# Patient Record
Sex: Female | Born: 1940 | Race: White | Hispanic: No | Marital: Married | Smoking: Never smoker
Health system: Southern US, Community
[De-identification: ages and names within clinical notes are randomized; demographics above are authoritative.]

## PROBLEM LIST (undated history)

## (undated) DIAGNOSIS — I10 Essential (primary) hypertension: Secondary | ICD-10-CM

## (undated) DIAGNOSIS — N289 Disorder of kidney and ureter, unspecified: Secondary | ICD-10-CM

---

## 1969-04-24 HISTORY — PX: ABDOMINAL HYSTERECTOMY: SUR658

## 2007-04-26 LAB — HM COLONOSCOPY: HM Colonoscopy: NORMAL

## 2010-08-12 ENCOUNTER — Ambulatory Visit: Payer: Self-pay | Admitting: Internal Medicine

## 2011-01-05 ENCOUNTER — Ambulatory Visit: Payer: Self-pay | Admitting: Internal Medicine

## 2011-07-07 ENCOUNTER — Emergency Department: Payer: Self-pay | Admitting: Emergency Medicine

## 2011-07-07 LAB — COMPREHENSIVE METABOLIC PANEL
Albumin: 3.9 g/dL (ref 3.4–5.0)
Anion Gap: 11 (ref 7–16)
Calcium, Total: 9.3 mg/dL (ref 8.5–10.1)
Chloride: 110 mmol/L — ABNORMAL HIGH (ref 98–107)
Co2: 21 mmol/L (ref 21–32)
EGFR (African American): 45 — ABNORMAL LOW
EGFR (Non-African Amer.): 37 — ABNORMAL LOW
Glucose: 84 mg/dL (ref 65–99)
Osmolality: 288 (ref 275–301)
Potassium: 4.8 mmol/L (ref 3.5–5.1)
SGOT(AST): 33 U/L (ref 15–37)

## 2011-07-07 LAB — CBC
HCT: 37.1 % (ref 35.0–47.0)
HGB: 12.6 g/dL (ref 12.0–16.0)
MCH: 31.7 pg (ref 26.0–34.0)
RBC: 3.98 10*6/uL (ref 3.80–5.20)
WBC: 6.9 10*3/uL (ref 3.6–11.0)

## 2011-07-07 LAB — TROPONIN I: Troponin-I: <0.02 ng/mL

## 2012-02-22 ENCOUNTER — Ambulatory Visit: Payer: Self-pay | Admitting: Internal Medicine

## 2013-03-04 ENCOUNTER — Ambulatory Visit: Payer: Self-pay | Admitting: Internal Medicine

## 2013-03-17 ENCOUNTER — Ambulatory Visit: Payer: Self-pay | Admitting: Internal Medicine

## 2013-05-13 IMAGING — MG MM DIGITAL SCREENING BILAT W/ CAD
1 series · 4 of 4 positions shown · non-contrast
Comparison: 01/22/2011, 01/03/2010.

REASON FOR EXAM: SCR MAMMO NO ORDER
COMMENTS:

PROCEDURE:     MMM - MMM DGT SCR NO ORDER W/CAD  - February 22, 2012  [DATE]
RESULT:

[Series 9688: R CC · right · 4 of 4 slices shown]
[im 1/4]
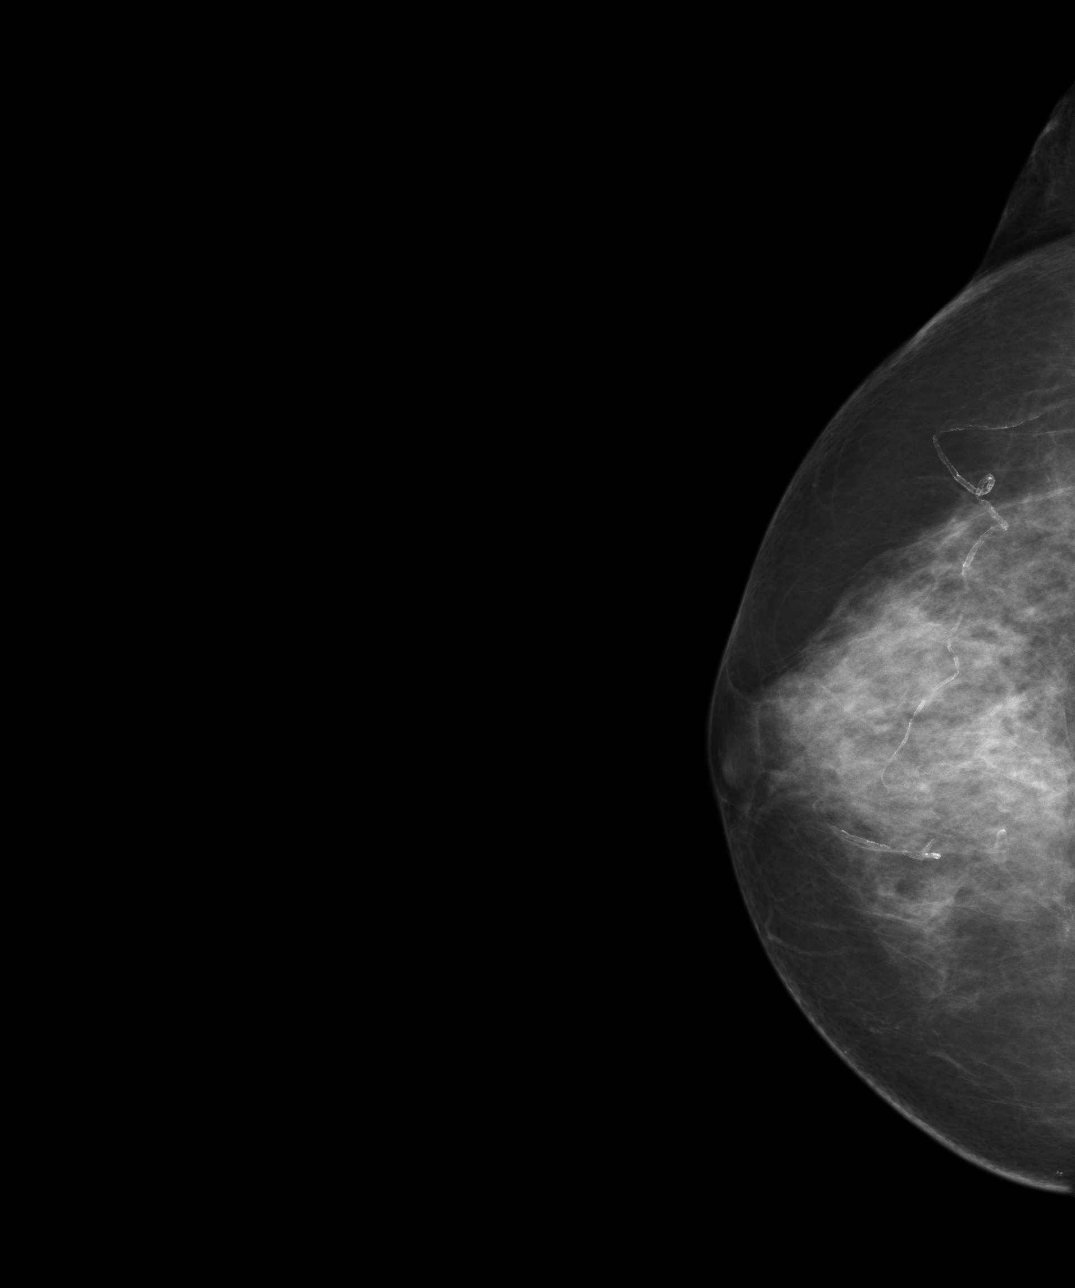
[im 2/4]
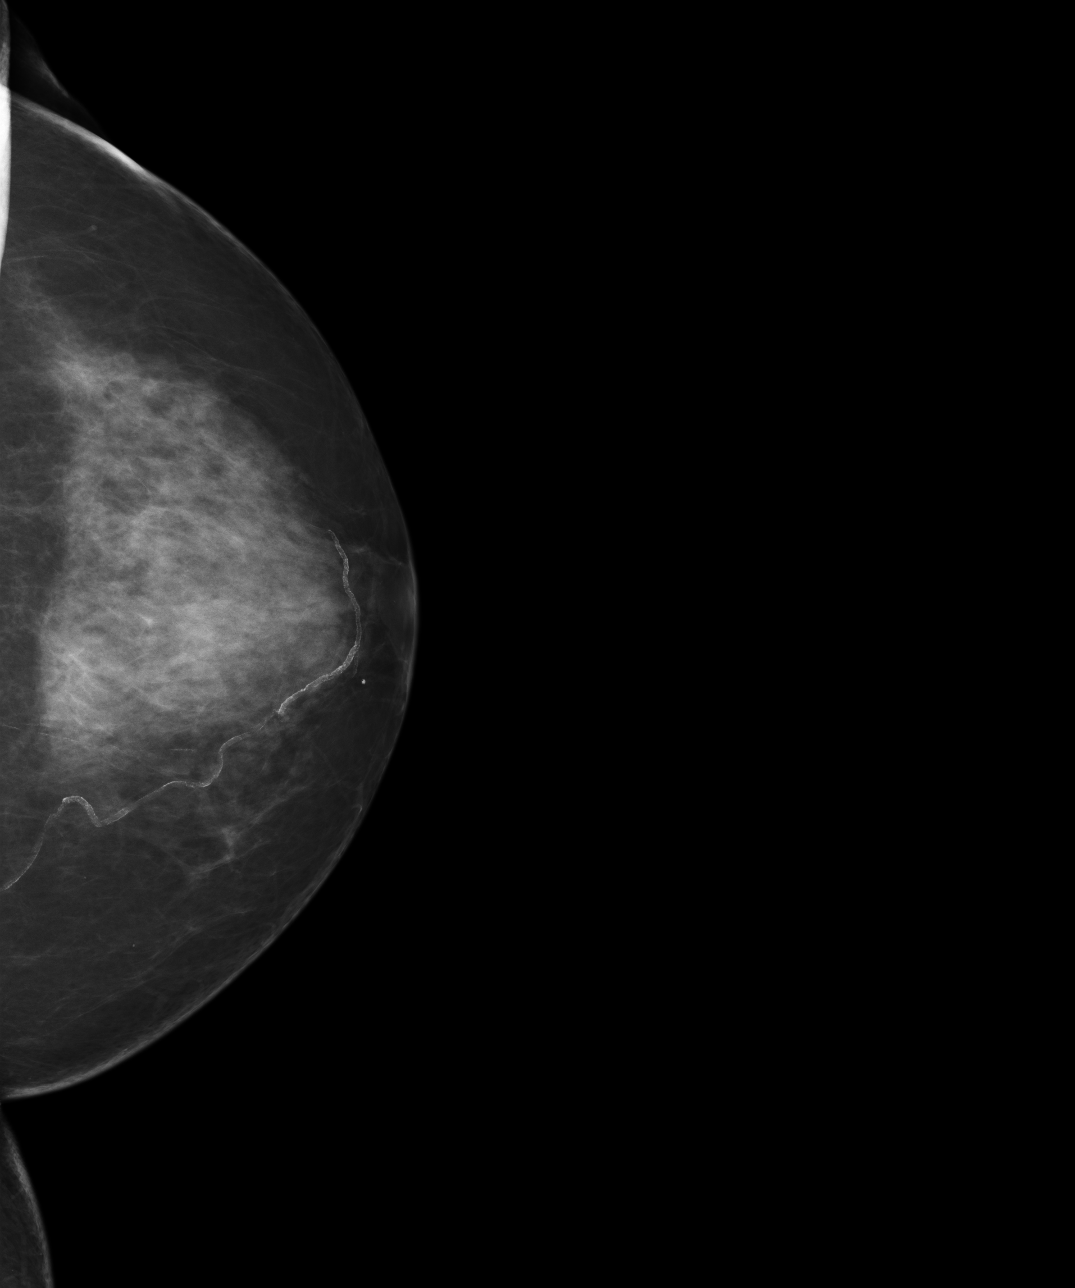
[im 3/4]
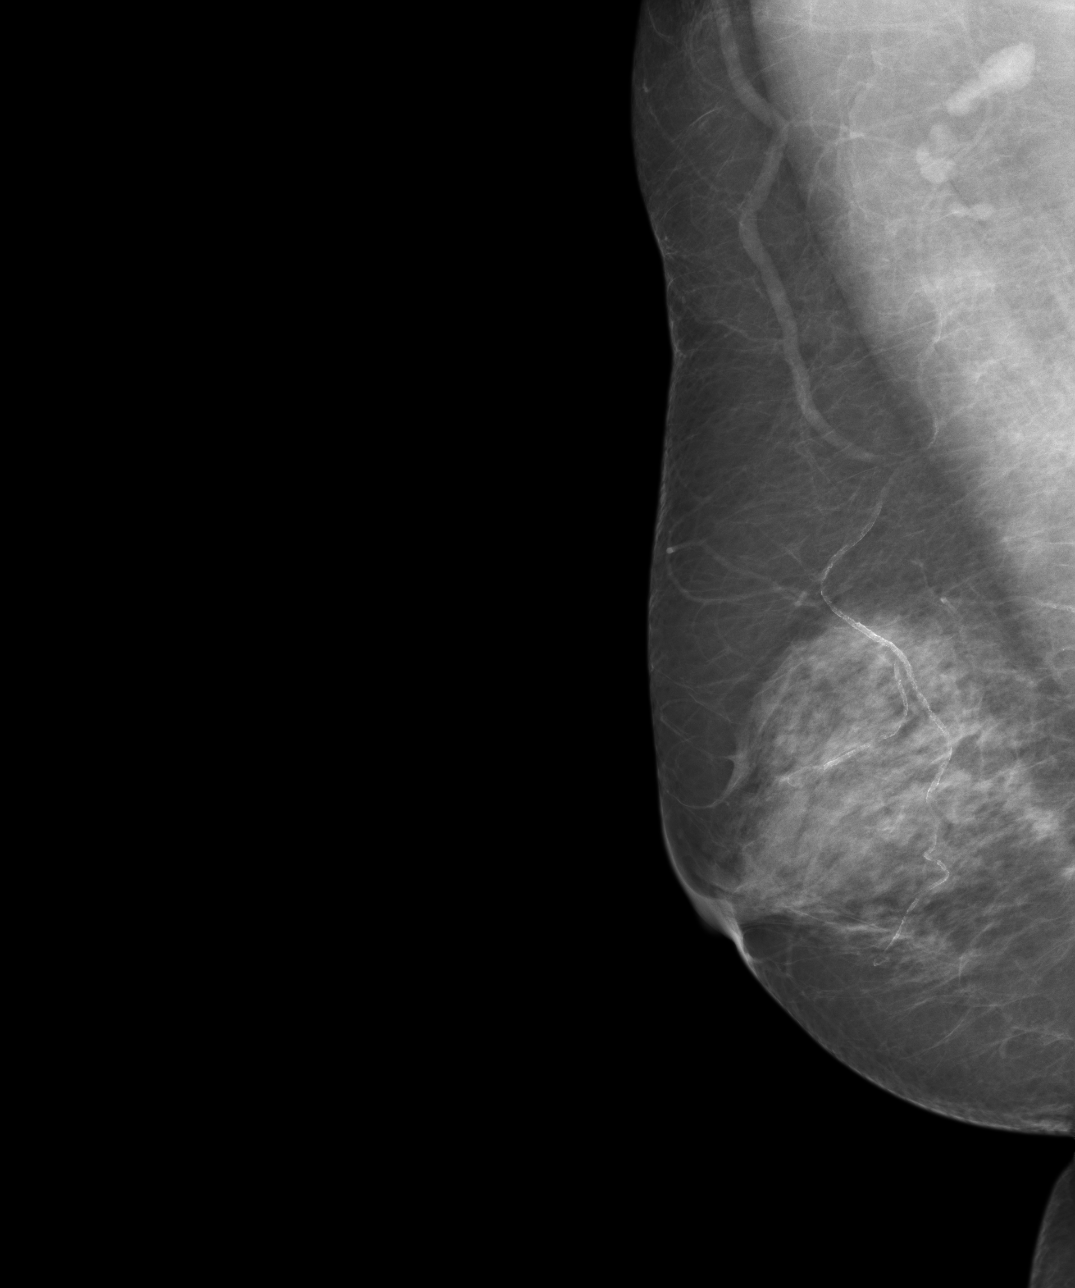
[im 4/4]
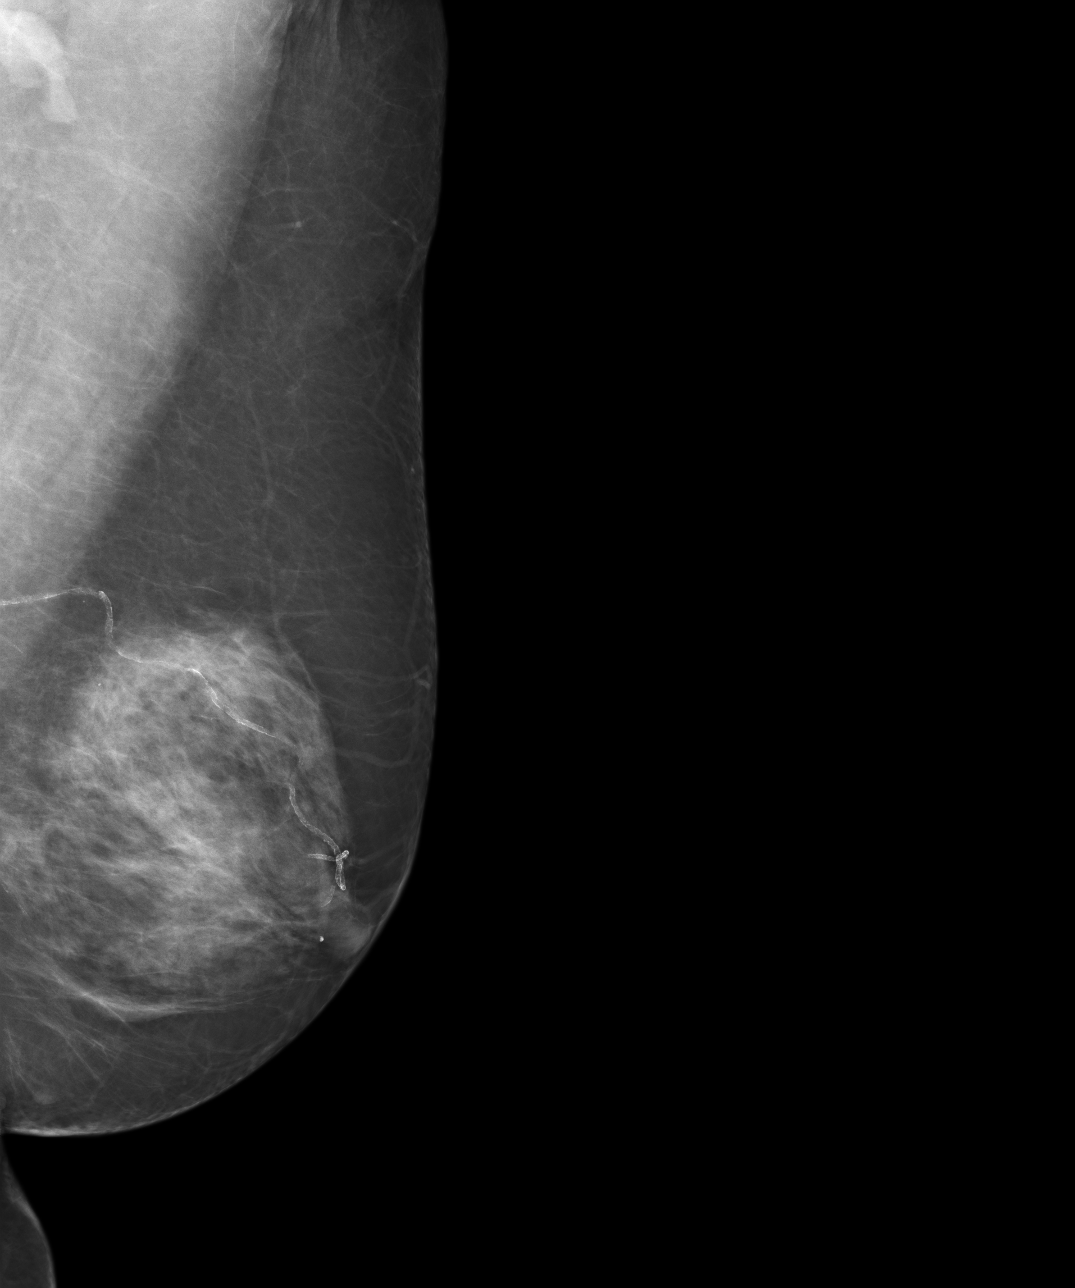

[4 of 4 positions shown; findings below may reference images not displayed]

FINDINGS: The breast tissue is heterogeneously dense, which may lower the sensitivity
of mammography. No suspicious masses or calcifications are identified. No
areas of architectural distortion.
IMPRESSION: 1.     BI-RADS: Category 1 Negative.
2.     Recommend continued annual screening mammography.

Thank you for this opportunity to contribute to the care of your patient.

A NEGATIVE MAMMOGRAM REPORT DOES NOT PRECLUDE BIOPSY OR OTHER EVALUATION OF
A CLINICALLY PALPABLE OR OTHERWISE SUSPICIOUS MASS OR LESION. BREAST CANCER
MAY NOT BE DETECTED BY MAMMOGRAPHY IN UP TO 10% OF CASES.

## 2014-03-24 LAB — LIPID PANEL
Cholesterol: 225 mg/dL — AB (ref 0–200)
HDL: 64 mg/dL (ref 35–70)
LDL CALC: 142 mg/dL
Triglycerides: 93 mg/dL (ref 40–160)

## 2014-03-24 LAB — CBC AND DIFFERENTIAL
HCT: 39 % (ref 36–46)
HEMOGLOBIN: 13 g/dL (ref 12.0–16.0)

## 2014-03-24 LAB — TSH: TSH: 1.5 u[IU]/mL (ref <=5.90)

## 2014-03-31 ENCOUNTER — Ambulatory Visit: Payer: Self-pay | Admitting: Internal Medicine

## 2014-03-31 LAB — HM MAMMOGRAPHY: HM MAMMO: NORMAL

## 2014-05-27 DIAGNOSIS — I129 Hypertensive chronic kidney disease with stage 1 through stage 4 chronic kidney disease, or unspecified chronic kidney disease: Secondary | ICD-10-CM | POA: Diagnosis not present

## 2014-05-27 DIAGNOSIS — E780 Pure hypercholesterolemia: Secondary | ICD-10-CM | POA: Diagnosis not present

## 2014-05-27 DIAGNOSIS — N183 Chronic kidney disease, stage 3 (moderate): Secondary | ICD-10-CM | POA: Diagnosis not present

## 2014-06-02 DIAGNOSIS — N1330 Unspecified hydronephrosis: Secondary | ICD-10-CM | POA: Diagnosis not present

## 2014-06-02 DIAGNOSIS — N271 Small kidney, bilateral: Secondary | ICD-10-CM | POA: Diagnosis not present

## 2014-06-12 DIAGNOSIS — N1330 Unspecified hydronephrosis: Secondary | ICD-10-CM | POA: Diagnosis not present

## 2014-06-12 DIAGNOSIS — R911 Solitary pulmonary nodule: Secondary | ICD-10-CM | POA: Diagnosis not present

## 2014-09-07 DIAGNOSIS — N184 Chronic kidney disease, stage 4 (severe): Secondary | ICD-10-CM | POA: Diagnosis not present

## 2014-09-07 LAB — BASIC METABOLIC PANEL
BUN: 25 mg/dL — AB (ref 4–21)
Creatinine: 1.4 mg/dL — AB (ref <=1.1)

## 2014-09-10 DIAGNOSIS — N183 Chronic kidney disease, stage 3 (moderate): Secondary | ICD-10-CM | POA: Diagnosis not present

## 2014-09-12 ENCOUNTER — Encounter: Payer: Self-pay | Admitting: Internal Medicine

## 2014-09-12 DIAGNOSIS — N6019 Diffuse cystic mastopathy of unspecified breast: Secondary | ICD-10-CM | POA: Insufficient documentation

## 2014-09-12 DIAGNOSIS — I10 Essential (primary) hypertension: Secondary | ICD-10-CM | POA: Insufficient documentation

## 2014-09-12 DIAGNOSIS — N184 Chronic kidney disease, stage 4 (severe): Secondary | ICD-10-CM | POA: Insufficient documentation

## 2014-09-12 DIAGNOSIS — E785 Hyperlipidemia, unspecified: Secondary | ICD-10-CM | POA: Insufficient documentation

## 2014-09-25 ENCOUNTER — Ambulatory Visit
Admission: EM | Admit: 2014-09-25 | Discharge: 2014-09-25 | Disposition: A | Discharge status: Home / Self Care | Payer: Medicare Other | Attending: Family Medicine | Admitting: Family Medicine

## 2014-09-25 ENCOUNTER — Encounter: Payer: Self-pay | Admitting: Registered Nurse

## 2014-09-25 DIAGNOSIS — L03116 Cellulitis of left lower limb: Secondary | ICD-10-CM | POA: Diagnosis not present

## 2014-09-25 DIAGNOSIS — N184 Chronic kidney disease, stage 4 (severe): Secondary | ICD-10-CM | POA: Diagnosis not present

## 2014-09-25 DIAGNOSIS — S80811A Abrasion, right lower leg, initial encounter: Secondary | ICD-10-CM

## 2014-09-25 DIAGNOSIS — L089 Local infection of the skin and subcutaneous tissue, unspecified: Secondary | ICD-10-CM

## 2014-09-25 MED ORDER — CEPHALEXIN 500 MG PO CAPS: 500.0000 mg | ORAL_CAPSULE | Freq: Two times a day (BID) | ORAL | Status: DC

## 2014-09-25 MED ORDER — MUPIROCIN CALCIUM 2 % EX CREA: 1.0000 "application " | TOPICAL_CREAM | Freq: Two times a day (BID) | CUTANEOUS | Status: AC

## 2014-09-25 NOTE — ED Provider Notes (Signed)
CSN: [id number]     Arrival date & time 09/25/14  1514 History   First MD Initiated Contact with Patient 09/25/14 1719     Chief Complaint  Patient presents with  . 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    History reviewed. No pertinent past medical history. Past Surgical History  Procedure Laterality Date  . Abdominal hysterectomy  1971    cervical dysplasia   Family History  Problem Relation Age of Onset  . Renal Disease Brother   . Renal Disease Mother    History  Substance Use Topics  . Smoking status: Never Smoker   . Smokeless tobacco: Not on file  . Alcohol Use: No   OB History    No data available     Review of Systems  Constitutional: Negative for fever, chills, diaphoresis, activity change, appetite change and fatigue.  HENT: Negative for congestion and nosebleeds.   Eyes: Negative for pain, discharge and itching.  Respiratory: Negative for cough, choking, chest tightness, shortness of breath, wheezing and stridor.   Cardiovascular: Positive for leg swelling. Negative for chest pain and palpitations.  Gastrointestinal: Negative for nausea, vomiting, diarrhea, constipation and blood in stool.  Endocrine: Negative for cold intolerance and heat intolerance.  Genitourinary: Negative for difficulty urinating.  Musculoskeletal: Positive for myalgias, joint swelling and arthralgias. Negative for back pain, gait problem, neck pain and neck stiffness.  Skin: Positive for rash and wound. Negative for color change and pallor.    Allergic/Immunologic: Negative for food allergies.  Neurological: Negative for dizziness, tremors, syncope, facial asymmetry, speech difficulty, weakness, light-headedness, numbness and headaches.  Hematological: Negative for adenopathy. Does not bruise/bleed easily.  Psychiatric/Behavioral: Negative for behavioral problems, confusion, sleep disturbance and agitation.    Allergies  Nitrofurantoin monohyd macro; Quinolones; and Sulfa antibiotics  Home Medications   Prior to Admission medications   Medication Sig Start Date End Date Taking? Authorizing Provider  amLODipine (NORVASC) 5 MG tablet Take 1 tablet by mouth daily. 03/24/14   Historical Provider, MD  aspirin 81 MG chewable tablet Chew 1 tablet by mouth daily.    Historical Provider, MD  benazepril (LOTENSIN) 20 MG tablet Take 1 tablet by mouth daily. 08/10/14   Historical Provider, MD  calcium-vitamin D (OSCAL-500) 500-400 MG-UNIT per tablet Take 1 tablet by mouth daily.    Historical Provider, MD  cephALEXin (KEFLEX) 500 MG capsule Take 1 capsule (500 mg total) by mouth 2 (two) times daily. 09/25/14   Barbaraann Barthel, NP  mupirocin cream (BACTROBAN) 2 % Apply 1 application topically 2 (two) times daily. 09/25/14   Jarold Song Kemauri Musa, NP   BP 144/70 mmHg  Pulse 84  Temp(Src) 98.2 F (36.8 C) (Oral)  Resp 16  Ht  (1.549 m)  Wt 138 lb (62.596 kg)  BMI 26.09 kg/m2  SpO2 97% Physical Exam  Constitutional: She is oriented to person, place, and time. Vital signs are normal. She appears well-developed and well-nourished. No distress.  HENT:  Head: Normocephalic and atraumatic.  Right Ear: External ear normal.  Left Ear: External ear normal.  Nose: Nose normal.  Mouth/Throat: Oropharynx is clear  and moist. No oropharyngeal exudate.  Eyes: Conjunctivae, EOM and lids are normal. Pupils are equal, round, and reactive to light. Right eye exhibits no discharge. Left eye exhibits no discharge. No scleral icterus.  Neck: Trachea  normal and normal range of motion. Neck supple. No tracheal deviation present.  Cardiovascular: Normal rate, regular rhythm and intact distal pulses.   Pulmonary/Chest: Effort normal. No respiratory distress. She has no wheezes.  Abdominal: Soft. She exhibits no distension.  Musculoskeletal: Normal range of motion. She exhibits edema. She exhibits no tenderness.  Lymphadenopathy:    She has no cervical adenopathy.  Neurological: She is alert and oriented to person, place, and time.  Skin: Skin is warm and dry. Abrasion and rash noted. No bruising, no burn, no ecchymosis, no laceration, no lesion, no petechiae and no purpura noted. Rash is macular and pustular. Rash is not papular, not maculopapular, not nodular, not vesicular and not urticarial. She is not diaphoretic. There is erythema. No cyanosis. No pallor. Nails show no clubbing.     Psychiatric: She has a normal mood and affect. Her speech is normal and behavior is normal. Judgment and thought content normal. Cognition and memory are normal.  Nursing note and vitals reviewed.   ED Course  Procedures (including critical care time) Labs Review Labs Reviewed - No data to display  Imaging Review No results found.   MDM   1. Chronic kidney disease (CKD), stage IV (severe)   2. Cellulitis of left lower extremity   3. Abrasion of leg with infection, right, initial encounter    Exitcare handout on skin infection and abrasion given to patient  Allergic sulfa and nitrofurantoin will treat with keflex renal dosing as GFR 39 500mg  po BID x 10 days and topical bactroban 2% BID.  Keep abrasion covered and do not submerge in tub, pool, lake.  May shower.  Hydrate hydrate hydrate avoid dehydration may take tylenol 500mg  po q6h prn pain.  Elevate extremity.  RTC if worsening erythema, pain, purulent discharge, fever.  Patient verbalized understanding, agreed with plan of care and had no further questions at this time.      Barbaraann Barthelina A Koya Hunger,  NP 09/25/14 1751

## 2014-09-25 NOTE — ED Notes (Signed)
Monday after lunch box slid down her right lower leg.  Typically has swelling in foot ankle but is worse.  Was scabbed but is open.  Worked 5 hours today on feet

## 2014-09-25 NOTE — Discharge Instructions (Signed)
Cellulitis Cellulitis is an infection of the skin and the tissue beneath it. The infected area is usually red and tender. Cellulitis occurs most often in the arms and lower legs.  CAUSES  Cellulitis is caused by bacteria that enter the skin through cracks or cuts in the skin. The most common types of bacteria that cause cellulitis are staphylococci and streptococci. SIGNS AND SYMPTOMS   Redness and warmth.  Swelling.  Tenderness or pain.  Fever. DIAGNOSIS  Your health care provider can usually determine what is wrong based on a physical exam. Blood tests may also be done. TREATMENT  Treatment usually involves taking an antibiotic medicine. HOME CARE INSTRUCTIONS   Take your antibiotic medicine as directed by your health care provider. Finish the antibiotic even if you start to feel better.  Keep the infected arm or leg elevated to reduce swelling.  Apply a warm cloth to the affected area up to 4 times per day to relieve pain.  Take medicines only as directed by your health care provider.  Keep all follow-up visits as directed by your health care provider. SEEK MEDICAL CARE IF:   You notice red streaks coming from the infected area.  Your red area gets larger or turns dark in color.  Your bone or joint underneath the infected area becomes painful after the skin has healed.  Your infection returns in the same area or another area.  You notice a swollen bump in the infected area.  You develop new symptoms.  You have a fever. SEEK IMMEDIATE MEDICAL CARE IF:   You feel very sleepy.  You develop vomiting or diarrhea.  You have a general ill feeling (malaise) with muscle aches and pains. MAKE SURE YOU:   Understand these instructions.  Will watch your condition.  Will get help right away if you are not doing well or get worse. Document Released: 01/18/2005 Document Revised: 08/25/2013 Document Reviewed: 06/26/2011 [facility] Patient Information 2015 [place], [place].  This information is not intended to replace advice given to you by your health care provider. Make sure you discuss any questions you have with your health care provider. Abrasion An abrasion is a cut or scrape of the skin. Abrasions do not extend through all layers of the skin and most heal within 10 days. It is important to care for your abrasion properly to prevent infection. CAUSES  Most abrasions are caused by falling on, or gliding across, the ground or other surface. When your skin rubs on something, the outer and inner layer of skin rubs off, causing an abrasion. DIAGNOSIS  Your caregiver will be able to diagnose an abrasion during a physical exam.  TREATMENT  Your treatment depends on how large and deep the abrasion is. Generally, your abrasion will be cleaned with water and a mild soap to remove any dirt or debris. An antibiotic ointment may be put over the abrasion to prevent an infection. A bandage (dressing) may be wrapped around the abrasion to keep it from getting dirty.  You may need a tetanus shot if:  You cannot remember when you had your last tetanus shot.  You have never had a tetanus shot.  The injury broke your skin. If you get a tetanus shot, your arm may swell, get red, and feel warm to the touch. This is common and not a problem. If you need a tetanus shot and you choose not to have one, there is a rare chance of getting tetanus. Sickness from tetanus can be serious.  HOME CARE INSTRUCTIONS   If a dressing was applied, change it at least once a day or as directed by your caregiver. If the bandage sticks, soak it off with warm water.   Wash the area with water and a mild soap to remove all the ointment 2 times a day. Rinse off the soap and pat the area dry with a clean towel.   Reapply any ointment as directed by your caregiver. This will help prevent infection and keep the bandage from sticking. Use gauze over the wound and under the dressing to help keep the bandage  from sticking.   Change your dressing right away if it becomes wet or dirty.   Only take over-the-counter or prescription medicines for pain, discomfort, or fever as directed by your caregiver.   Follow up with your caregiver within 24-48 hours for a wound check, or as directed. If you were not given a wound-check appointment, look closely at your abrasion for redness, swelling, or pus. These are signs of infection. SEEK IMMEDIATE MEDICAL CARE IF:   You have increasing pain in the wound.   You have redness, swelling, or tenderness around the wound.   You have pus coming from the wound.   You have a fever or persistent symptoms for more than 2-3 days.  You have a fever and your symptoms suddenly get worse.  You have a bad smell coming from the wound or dressing.  MAKE SURE YOU:   Understand these instructions.  Will watch your condition.  Will get help right away if you are not doing well or get worse. Document Released: 01/18/2005 Document Revised: 03/27/2012 Document Reviewed: 03/14/2011 [facility] Patient Information 2015 [place], [place]. This information is not intended to replace advice given to you by your health care provider. Make sure you discuss any questions you have with your health care provider.

## 2015-01-01 ENCOUNTER — Encounter: Payer: Self-pay | Admitting: Internal Medicine

## 2015-01-01 ENCOUNTER — Ambulatory Visit (INDEPENDENT_AMBULATORY_CARE_PROVIDER_SITE_OTHER): Payer: Medicare Other | Admitting: Internal Medicine

## 2015-01-01 VITALS — BP 120/64 | HR 94 | Temp 98.1°F | Ht 61.0 in | Wt 147.2 lb

## 2015-01-01 DIAGNOSIS — J069 Acute upper respiratory infection, unspecified: Secondary | ICD-10-CM | POA: Diagnosis not present

## 2015-01-01 MED ORDER — GUAIFENESIN-CODEINE 100-10 MG/5ML PO SYRP: 5.0000 mL | ORAL_SOLUTION | Freq: Three times a day (TID) | ORAL | Status: AC | PRN

## 2015-01-01 NOTE — Progress Notes (Signed)
Date:  01/01/2015   Name:  Meghan Wood   DOB:  03-06-1941   MRN:  [id number]   Chief Complaint: Cough Cough Associated symptoms include a sore throat. Pertinent negatives include no chest pain, chills, ear pain, fever, shortness of breath or wheezing.   patient began having cough 2 days ago. No head congestion and ear pain or nasal discharge. Her throat a little sore. She denies fever or chills. The cough is completely dry however it's keeping her awake at night. She began taking Delsym and Mucinex which has helped. Her only other complaint is mild fatigue.   Review of Systems:  Review of Systems  Constitutional: Positive for fatigue. Negative for fever and chills.  HENT: Positive for sore throat. Negative for ear discharge and ear pain.   Eyes: Negative for visual disturbance.  Respiratory: Positive for cough. Negative for choking, chest tightness, shortness of breath and wheezing.   Cardiovascular: Negative for chest pain and palpitations.  Musculoskeletal: Positive for neck stiffness. Negative for neck pain.    Patient Active Problem List   Diagnosis Date Noted  . Chronic kidney disease (CKD), stage IV (severe) 09/12/2014  . Dyslipidemia 09/12/2014  . Essential (primary) hypertension 09/12/2014  . Fibrocystic breast 09/12/2014    Prior to Admission medications   Medication Sig Start Date End Date Taking? Authorizing Provider  amLODipine (NORVASC) 5 MG tablet Take 1 tablet by mouth daily. 03/24/14  Yes Historical Provider, MD  aspirin 81 MG chewable tablet Chew 1 tablet by mouth daily.   Yes Historical Provider, MD  benazepril (LOTENSIN) 20 MG tablet Take 1 tablet by mouth daily. 08/10/14  Yes Historical Provider, MD  calcium-vitamin D (OSCAL-500) 500-400 MG-UNIT per tablet Take 1 tablet by mouth daily.   Yes Historical Provider, MD  mupirocin cream (BACTROBAN) 2 % Apply 1 application topically 2 (two) times daily. 09/25/14  Yes Barbaraann Barthel, NP    Allergies  Allergen  Reactions  . Nitrofurantoin Monohyd Macro     Other reaction(s): Edema  . Quinolones Palpitations  . Sulfa Antibiotics Rash    Past Surgical History  Procedure Laterality Date  . Abdominal hysterectomy  1971    cervical dysplasia    Social History  Substance Use Topics  . Smoking status: Never Smoker   . Smokeless tobacco: None  . Alcohol Use: No     Medication list has been reviewed and updated.  Physical Examination:  Physical Exam  Constitutional: She appears well-developed and well-nourished.  HENT:  Right Ear: Tympanic membrane and ear canal normal.  Left Ear: Tympanic membrane and ear canal normal.  Nose: Right sinus exhibits no maxillary sinus tenderness and no frontal sinus tenderness. Left sinus exhibits no maxillary sinus tenderness and no frontal sinus tenderness.  Mouth/Throat: Uvula is midline and oropharynx is clear and moist.  Eyes: Pupils are equal, round, and reactive to light.  Neck: Normal range of motion. Neck supple. No thyromegaly present.  Cardiovascular: Normal rate, regular rhythm and normal heart sounds.   Pulmonary/Chest: Effort normal and breath sounds normal. No respiratory distress. She has no wheezes.  Lymphadenopathy:    She has no cervical adenopathy.    BP 120/64 mmHg  Pulse 94  Temp(Src) 98.1 F (36.7 C)  Ht 5\' 1"  (1.549 m)  Wt 147 lb 3.2 oz (66.769 kg)  BMI 27.83 kg/m2  SpO2 97%  Assessment and Plan: 1. Viral URI Continue Delsym and Mucinex during the day Increase fluids Use narcotic cough syrup at night to allow  good rest If symptoms of infection occur call for an antibiotic. - guaiFENesin-codeine (ROBITUSSIN AC) 100-10 MG/5ML syrup; Take 5 mLs by mouth 3 (three) times daily as needed for cough.  Dispense: 150 mL; Refill: 0   Bari Edward, MD [facility]  01/01/2015

## 2015-03-25 ENCOUNTER — Other Ambulatory Visit: Payer: Self-pay | Admitting: Internal Medicine

## 2015-04-05 DIAGNOSIS — N6019 Diffuse cystic mastopathy of unspecified breast: Secondary | ICD-10-CM | POA: Diagnosis not present

## 2015-04-05 DIAGNOSIS — N183 Chronic kidney disease, stage 3 (moderate): Secondary | ICD-10-CM | POA: Diagnosis not present

## 2015-04-05 DIAGNOSIS — N189 Chronic kidney disease, unspecified: Secondary | ICD-10-CM | POA: Diagnosis not present

## 2015-04-05 DIAGNOSIS — I10 Essential (primary) hypertension: Secondary | ICD-10-CM | POA: Diagnosis not present

## 2015-04-05 DIAGNOSIS — Z0000 Encounter for general adult medical examination without abnormal findings: Secondary | ICD-10-CM | POA: Diagnosis not present

## 2015-04-05 DIAGNOSIS — Z1239 Encounter for other screening for malignant neoplasm of breast: Secondary | ICD-10-CM | POA: Diagnosis not present

## 2015-04-05 DIAGNOSIS — I129 Hypertensive chronic kidney disease with stage 1 through stage 4 chronic kidney disease, or unspecified chronic kidney disease: Secondary | ICD-10-CM | POA: Diagnosis not present

## 2015-04-08 DIAGNOSIS — N183 Chronic kidney disease, stage 3 (moderate): Secondary | ICD-10-CM | POA: Diagnosis not present

## 2015-04-28 DIAGNOSIS — N2889 Other specified disorders of kidney and ureter: Secondary | ICD-10-CM | POA: Diagnosis not present

## 2015-04-28 DIAGNOSIS — N1330 Unspecified hydronephrosis: Secondary | ICD-10-CM | POA: Diagnosis not present

## 2015-04-28 DIAGNOSIS — Z1231 Encounter for screening mammogram for malignant neoplasm of breast: Secondary | ICD-10-CM | POA: Diagnosis not present

## 2015-04-29 DIAGNOSIS — N183 Chronic kidney disease, stage 3 (moderate): Secondary | ICD-10-CM | POA: Diagnosis not present

## 2015-05-07 DIAGNOSIS — R928 Other abnormal and inconclusive findings on diagnostic imaging of breast: Secondary | ICD-10-CM | POA: Diagnosis not present

## 2015-05-27 ENCOUNTER — Other Ambulatory Visit: Payer: Self-pay | Admitting: Internal Medicine

## 2015-05-27 NOTE — Telephone Encounter (Signed)
Pt said she is no longer coming here she started seeing another doctor for her kidney's at [facility] now.

## 2015-06-28 DIAGNOSIS — N183 Chronic kidney disease, stage 3 (moderate): Secondary | ICD-10-CM | POA: Diagnosis not present

## 2015-06-28 DIAGNOSIS — N1330 Unspecified hydronephrosis: Secondary | ICD-10-CM | POA: Diagnosis not present

## 2015-07-05 DIAGNOSIS — N1330 Unspecified hydronephrosis: Secondary | ICD-10-CM | POA: Diagnosis not present

## 2015-08-05 DIAGNOSIS — H2513 Age-related nuclear cataract, bilateral: Secondary | ICD-10-CM | POA: Diagnosis not present

## 2015-10-06 DIAGNOSIS — I10 Essential (primary) hypertension: Secondary | ICD-10-CM | POA: Diagnosis not present

## 2015-10-06 DIAGNOSIS — N183 Chronic kidney disease, stage 3 (moderate): Secondary | ICD-10-CM | POA: Diagnosis not present

## 2015-10-06 DIAGNOSIS — I129 Hypertensive chronic kidney disease with stage 1 through stage 4 chronic kidney disease, or unspecified chronic kidney disease: Secondary | ICD-10-CM | POA: Diagnosis not present

## 2015-12-21 DIAGNOSIS — N183 Chronic kidney disease, stage 3 (moderate): Secondary | ICD-10-CM | POA: Diagnosis not present

## 2015-12-22 DIAGNOSIS — N183 Chronic kidney disease, stage 3 (moderate): Secondary | ICD-10-CM | POA: Diagnosis not present

## 2015-12-28 DIAGNOSIS — N1330 Unspecified hydronephrosis: Secondary | ICD-10-CM | POA: Diagnosis not present

## 2015-12-28 DIAGNOSIS — N183 Chronic kidney disease, stage 3 (moderate): Secondary | ICD-10-CM | POA: Diagnosis not present

## 2015-12-28 DIAGNOSIS — N2889 Other specified disorders of kidney and ureter: Secondary | ICD-10-CM | POA: Diagnosis not present

## 2015-12-28 DIAGNOSIS — N271 Small kidney, bilateral: Secondary | ICD-10-CM | POA: Diagnosis not present

## 2016-01-04 DIAGNOSIS — Z79899 Other long term (current) drug therapy: Secondary | ICD-10-CM | POA: Diagnosis not present

## 2016-01-04 DIAGNOSIS — I10 Essential (primary) hypertension: Secondary | ICD-10-CM | POA: Diagnosis not present

## 2016-01-10 DIAGNOSIS — I129 Hypertensive chronic kidney disease with stage 1 through stage 4 chronic kidney disease, or unspecified chronic kidney disease: Secondary | ICD-10-CM | POA: Diagnosis not present

## 2016-01-10 DIAGNOSIS — N183 Chronic kidney disease, stage 3 (moderate): Secondary | ICD-10-CM | POA: Diagnosis not present

## 2016-03-02 DIAGNOSIS — I129 Hypertensive chronic kidney disease with stage 1 through stage 4 chronic kidney disease, or unspecified chronic kidney disease: Secondary | ICD-10-CM | POA: Diagnosis not present

## 2016-03-02 DIAGNOSIS — N183 Chronic kidney disease, stage 3 (moderate): Secondary | ICD-10-CM | POA: Diagnosis not present

## 2016-04-10 DIAGNOSIS — Z0000 Encounter for general adult medical examination without abnormal findings: Secondary | ICD-10-CM | POA: Diagnosis not present

## 2016-04-10 DIAGNOSIS — Z13820 Encounter for screening for osteoporosis: Secondary | ICD-10-CM | POA: Diagnosis not present

## 2016-08-22 DIAGNOSIS — N183 Chronic kidney disease, stage 3 (moderate): Secondary | ICD-10-CM | POA: Diagnosis not present

## 2016-08-29 DIAGNOSIS — N183 Chronic kidney disease, stage 3 (moderate): Secondary | ICD-10-CM | POA: Diagnosis not present

## 2016-09-12 DIAGNOSIS — M62838 Other muscle spasm: Secondary | ICD-10-CM | POA: Diagnosis not present

## 2016-09-12 DIAGNOSIS — S39012A Strain of muscle, fascia and tendon of lower back, initial encounter: Secondary | ICD-10-CM | POA: Diagnosis not present

## 2016-09-19 DIAGNOSIS — M545 Low back pain: Secondary | ICD-10-CM | POA: Diagnosis not present

## 2016-10-02 DIAGNOSIS — N183 Chronic kidney disease, stage 3 (moderate): Secondary | ICD-10-CM | POA: Diagnosis not present

## 2016-10-04 DIAGNOSIS — M546 Pain in thoracic spine: Secondary | ICD-10-CM | POA: Diagnosis not present

## 2016-10-04 DIAGNOSIS — M5136 Other intervertebral disc degeneration, lumbar region: Secondary | ICD-10-CM | POA: Diagnosis not present

## 2016-10-04 DIAGNOSIS — M9902 Segmental and somatic dysfunction of thoracic region: Secondary | ICD-10-CM | POA: Diagnosis not present

## 2016-10-04 DIAGNOSIS — M9903 Segmental and somatic dysfunction of lumbar region: Secondary | ICD-10-CM | POA: Diagnosis not present

## 2016-10-04 DIAGNOSIS — M9905 Segmental and somatic dysfunction of pelvic region: Secondary | ICD-10-CM | POA: Diagnosis not present

## 2016-10-05 DIAGNOSIS — M9903 Segmental and somatic dysfunction of lumbar region: Secondary | ICD-10-CM | POA: Diagnosis not present

## 2016-10-05 DIAGNOSIS — M5136 Other intervertebral disc degeneration, lumbar region: Secondary | ICD-10-CM | POA: Diagnosis not present

## 2016-10-05 DIAGNOSIS — M9905 Segmental and somatic dysfunction of pelvic region: Secondary | ICD-10-CM | POA: Diagnosis not present

## 2016-10-05 DIAGNOSIS — M9902 Segmental and somatic dysfunction of thoracic region: Secondary | ICD-10-CM | POA: Diagnosis not present

## 2016-10-05 DIAGNOSIS — M546 Pain in thoracic spine: Secondary | ICD-10-CM | POA: Diagnosis not present

## 2016-10-06 DIAGNOSIS — M9903 Segmental and somatic dysfunction of lumbar region: Secondary | ICD-10-CM | POA: Diagnosis not present

## 2016-10-06 DIAGNOSIS — M5136 Other intervertebral disc degeneration, lumbar region: Secondary | ICD-10-CM | POA: Diagnosis not present

## 2016-10-06 DIAGNOSIS — M9905 Segmental and somatic dysfunction of pelvic region: Secondary | ICD-10-CM | POA: Diagnosis not present

## 2016-10-06 DIAGNOSIS — M546 Pain in thoracic spine: Secondary | ICD-10-CM | POA: Diagnosis not present

## 2016-10-06 DIAGNOSIS — M9902 Segmental and somatic dysfunction of thoracic region: Secondary | ICD-10-CM | POA: Diagnosis not present

## 2016-10-09 DIAGNOSIS — M9902 Segmental and somatic dysfunction of thoracic region: Secondary | ICD-10-CM | POA: Diagnosis not present

## 2016-10-09 DIAGNOSIS — M9903 Segmental and somatic dysfunction of lumbar region: Secondary | ICD-10-CM | POA: Diagnosis not present

## 2016-10-09 DIAGNOSIS — M9905 Segmental and somatic dysfunction of pelvic region: Secondary | ICD-10-CM | POA: Diagnosis not present

## 2016-10-09 DIAGNOSIS — M546 Pain in thoracic spine: Secondary | ICD-10-CM | POA: Diagnosis not present

## 2016-10-09 DIAGNOSIS — M5136 Other intervertebral disc degeneration, lumbar region: Secondary | ICD-10-CM | POA: Diagnosis not present

## 2016-10-11 DIAGNOSIS — M9905 Segmental and somatic dysfunction of pelvic region: Secondary | ICD-10-CM | POA: Diagnosis not present

## 2016-10-11 DIAGNOSIS — M9902 Segmental and somatic dysfunction of thoracic region: Secondary | ICD-10-CM | POA: Diagnosis not present

## 2016-10-11 DIAGNOSIS — M5136 Other intervertebral disc degeneration, lumbar region: Secondary | ICD-10-CM | POA: Diagnosis not present

## 2016-10-11 DIAGNOSIS — M546 Pain in thoracic spine: Secondary | ICD-10-CM | POA: Diagnosis not present

## 2016-10-11 DIAGNOSIS — M9903 Segmental and somatic dysfunction of lumbar region: Secondary | ICD-10-CM | POA: Diagnosis not present

## 2016-10-12 DIAGNOSIS — M9903 Segmental and somatic dysfunction of lumbar region: Secondary | ICD-10-CM | POA: Diagnosis not present

## 2016-10-12 DIAGNOSIS — M546 Pain in thoracic spine: Secondary | ICD-10-CM | POA: Diagnosis not present

## 2016-10-12 DIAGNOSIS — M9902 Segmental and somatic dysfunction of thoracic region: Secondary | ICD-10-CM | POA: Diagnosis not present

## 2016-10-12 DIAGNOSIS — M9905 Segmental and somatic dysfunction of pelvic region: Secondary | ICD-10-CM | POA: Diagnosis not present

## 2016-10-12 DIAGNOSIS — M5136 Other intervertebral disc degeneration, lumbar region: Secondary | ICD-10-CM | POA: Diagnosis not present

## 2016-10-18 DIAGNOSIS — M9903 Segmental and somatic dysfunction of lumbar region: Secondary | ICD-10-CM | POA: Diagnosis not present

## 2016-10-18 DIAGNOSIS — M546 Pain in thoracic spine: Secondary | ICD-10-CM | POA: Diagnosis not present

## 2016-10-18 DIAGNOSIS — M9902 Segmental and somatic dysfunction of thoracic region: Secondary | ICD-10-CM | POA: Diagnosis not present

## 2016-10-18 DIAGNOSIS — M5136 Other intervertebral disc degeneration, lumbar region: Secondary | ICD-10-CM | POA: Diagnosis not present

## 2016-10-18 DIAGNOSIS — M9905 Segmental and somatic dysfunction of pelvic region: Secondary | ICD-10-CM | POA: Diagnosis not present

## 2016-10-23 DIAGNOSIS — M9903 Segmental and somatic dysfunction of lumbar region: Secondary | ICD-10-CM | POA: Diagnosis not present

## 2016-10-23 DIAGNOSIS — M546 Pain in thoracic spine: Secondary | ICD-10-CM | POA: Diagnosis not present

## 2016-10-23 DIAGNOSIS — M9902 Segmental and somatic dysfunction of thoracic region: Secondary | ICD-10-CM | POA: Diagnosis not present

## 2016-10-23 DIAGNOSIS — M9905 Segmental and somatic dysfunction of pelvic region: Secondary | ICD-10-CM | POA: Diagnosis not present

## 2016-10-23 DIAGNOSIS — M5136 Other intervertebral disc degeneration, lumbar region: Secondary | ICD-10-CM | POA: Diagnosis not present

## 2016-10-31 DIAGNOSIS — M9902 Segmental and somatic dysfunction of thoracic region: Secondary | ICD-10-CM | POA: Diagnosis not present

## 2016-10-31 DIAGNOSIS — M5136 Other intervertebral disc degeneration, lumbar region: Secondary | ICD-10-CM | POA: Diagnosis not present

## 2016-10-31 DIAGNOSIS — M546 Pain in thoracic spine: Secondary | ICD-10-CM | POA: Diagnosis not present

## 2016-10-31 DIAGNOSIS — M9905 Segmental and somatic dysfunction of pelvic region: Secondary | ICD-10-CM | POA: Diagnosis not present

## 2016-10-31 DIAGNOSIS — M9903 Segmental and somatic dysfunction of lumbar region: Secondary | ICD-10-CM | POA: Diagnosis not present

## 2016-11-16 DIAGNOSIS — M9905 Segmental and somatic dysfunction of pelvic region: Secondary | ICD-10-CM | POA: Diagnosis not present

## 2016-11-16 DIAGNOSIS — M9903 Segmental and somatic dysfunction of lumbar region: Secondary | ICD-10-CM | POA: Diagnosis not present

## 2016-11-16 DIAGNOSIS — M9902 Segmental and somatic dysfunction of thoracic region: Secondary | ICD-10-CM | POA: Diagnosis not present

## 2016-11-16 DIAGNOSIS — M546 Pain in thoracic spine: Secondary | ICD-10-CM | POA: Diagnosis not present

## 2016-11-16 DIAGNOSIS — M5136 Other intervertebral disc degeneration, lumbar region: Secondary | ICD-10-CM | POA: Diagnosis not present

## 2016-11-27 DIAGNOSIS — N183 Chronic kidney disease, stage 3 (moderate): Secondary | ICD-10-CM | POA: Diagnosis not present

## 2016-11-27 DIAGNOSIS — I129 Hypertensive chronic kidney disease with stage 1 through stage 4 chronic kidney disease, or unspecified chronic kidney disease: Secondary | ICD-10-CM | POA: Diagnosis not present

## 2016-12-18 DIAGNOSIS — M9903 Segmental and somatic dysfunction of lumbar region: Secondary | ICD-10-CM | POA: Diagnosis not present

## 2016-12-18 DIAGNOSIS — M546 Pain in thoracic spine: Secondary | ICD-10-CM | POA: Diagnosis not present

## 2016-12-18 DIAGNOSIS — M5136 Other intervertebral disc degeneration, lumbar region: Secondary | ICD-10-CM | POA: Diagnosis not present

## 2016-12-18 DIAGNOSIS — M9905 Segmental and somatic dysfunction of pelvic region: Secondary | ICD-10-CM | POA: Diagnosis not present

## 2016-12-18 DIAGNOSIS — M9902 Segmental and somatic dysfunction of thoracic region: Secondary | ICD-10-CM | POA: Diagnosis not present

## 2017-01-31 ENCOUNTER — Encounter: Payer: Self-pay | Admitting: *Deleted

## 2017-01-31 ENCOUNTER — Ambulatory Visit
Admission: EM | Admit: 2017-01-31 | Discharge: 2017-01-31 | Disposition: A | Discharge status: Home / Self Care | Payer: Medicare Other

## 2017-01-31 DIAGNOSIS — M25562 Pain in left knee: Secondary | ICD-10-CM

## 2017-01-31 DIAGNOSIS — S99912A Unspecified injury of left ankle, initial encounter: Secondary | ICD-10-CM | POA: Diagnosis not present

## 2017-01-31 DIAGNOSIS — M25462 Effusion, left knee: Secondary | ICD-10-CM

## 2017-01-31 DIAGNOSIS — M7989 Other specified soft tissue disorders: Secondary | ICD-10-CM | POA: Diagnosis present

## 2017-01-31 DIAGNOSIS — Z7982 Long term (current) use of aspirin: Secondary | ICD-10-CM | POA: Diagnosis not present

## 2017-01-31 DIAGNOSIS — Z885 Allergy status to narcotic agent status: Secondary | ICD-10-CM | POA: Diagnosis not present

## 2017-01-31 DIAGNOSIS — I129 Hypertensive chronic kidney disease with stage 1 through stage 4 chronic kidney disease, or unspecified chronic kidney disease: Secondary | ICD-10-CM | POA: Diagnosis not present

## 2017-01-31 DIAGNOSIS — S8992XA Unspecified injury of left lower leg, initial encounter: Secondary | ICD-10-CM | POA: Diagnosis not present

## 2017-01-31 DIAGNOSIS — M79662 Pain in left lower leg: Secondary | ICD-10-CM | POA: Diagnosis present

## 2017-01-31 DIAGNOSIS — M11262 Other chondrocalcinosis, left knee: Secondary | ICD-10-CM | POA: Diagnosis not present

## 2017-01-31 DIAGNOSIS — E785 Hyperlipidemia, unspecified: Secondary | ICD-10-CM | POA: Diagnosis not present

## 2017-01-31 DIAGNOSIS — M25472 Effusion, left ankle: Secondary | ICD-10-CM | POA: Diagnosis not present

## 2017-01-31 DIAGNOSIS — N189 Chronic kidney disease, unspecified: Secondary | ICD-10-CM | POA: Diagnosis not present

## 2017-01-31 DIAGNOSIS — Z882 Allergy status to sulfonamides status: Secondary | ICD-10-CM | POA: Diagnosis not present

## 2017-01-31 DIAGNOSIS — M1712 Unilateral primary osteoarthritis, left knee: Secondary | ICD-10-CM | POA: Diagnosis not present

## 2017-01-31 DIAGNOSIS — Z883 Allergy status to other anti-infective agents status: Secondary | ICD-10-CM | POA: Diagnosis not present

## 2017-01-31 HISTORY — DX: Essential (primary) hypertension: I10

## 2017-01-31 HISTORY — DX: Disorder of kidney and ureter, unspecified: N28.9

## 2017-01-31 NOTE — ED Triage Notes (Signed)
Pt felt two weeks ago and hurt her left knee. She is now having swelling to her left ankle. Noted a rash to left ankle.

## 2017-01-31 NOTE — Discharge Instructions (Addendum)
We are unable to perform [place] of lowere extremity in UC, please go to ER or outpatient imaging ([facility]-your choice) for imaging of left knee/ tibia  r/o occult fracture and US doppler study of left lower extremity r/o DVT. Rest,ice,elevate extremity.

## 2017-01-31 NOTE — ED Provider Notes (Signed)
[facility]    CSN: [id number] Arrival date & time: 01/31/17  1208     History   Chief Complaint Chief Complaint  Patient presents with  . Joint Swelling    HPI Meghan Wood is a 76 y.o. female.   76 yr old female presents to UC with cc of left knee/leg pain after falling on floor at home in [place] while waking it. Pt also rode in care from [place] to [place], has erythema, edema pain to left leg.    The history is provided by the patient. No language interpreter was used.    Past Medical History:  Diagnosis Date  . Hypertension   . Renal insufficiency     Patient Active Problem List   Diagnosis Date Noted  . Pain and swelling of left lower leg 01/31/2017  . Acute pain of left knee 01/31/2017  . Chronic kidney disease (CKD), stage IV (severe) (HCC) 09/12/2014  . Dyslipidemia 09/12/2014  . Essential (primary) hypertension 09/12/2014  . Fibrocystic breast 09/12/2014    Past Surgical History:  Procedure Laterality Date  . ABDOMINAL HYSTERECTOMY  1971   cervical dysplasia    OB History    No data available       Home Medications    Prior to Admission medications   Medication Sig Start Date End Date Taking? Authorizing Provider  amLODipine (NORVASC) 5 MG tablet Take 1 tablet by mouth  daily 03/25/15  Yes Reubin Milan, MD  aspirin 81 MG chewable tablet Chew 1 tablet by mouth daily.   Yes [provider]  calcium-vitamin D (OSCAL-500) 500-400 MG-UNIT per tablet Take 1 tablet by mouth daily.   Yes [provider]  benazepril (LOTENSIN) 20 MG tablet Take 1 tablet by mouth  every evening 05/27/15   Reubin Milan, MD  guaiFENesin-codeine [facility]) 100-10 MG/5ML syrup Take 5 mLs by mouth 3 (three) times daily as needed for cough. 01/01/15   Reubin Milan, MD  mupirocin cream (BACTROBAN) 2 % Apply 1 application topically 2 (two) times daily. 09/25/14   Betancourt, Jarold Song, NP    Family History Family History  Problem  Relation Age of Onset  . Renal Disease Brother   . Renal Disease Mother     Social History Social History  Substance Use Topics  . Smoking status: Never Smoker  . Smokeless tobacco: Never Used  . Alcohol use No     Allergies   Nitrofurantoin monohyd macro; Quinolones; and Sulfa antibiotics   Review of Systems Review of Systems  Constitutional: Negative for fever.  Cardiovascular: Positive for leg swelling.  Musculoskeletal: Positive for arthralgias and joint swelling.  Skin: Positive for color change.  All other systems reviewed and are negative.    Physical Exam Triage Vital Signs ED Triage Vitals  Enc Vitals Group     BP 01/31/17 1255 (!) 159/90     Pulse Rate 01/31/17 1255 72     Resp 01/31/17 1255 12     Temp 01/31/17 1255 98.2 F (36.8 C)     Temp Source 01/31/17 1255 Oral     SpO2 01/31/17 1255 97 %     Weight 01/31/17 1249 142 lb (64.4 kg)     Height 01/31/17 1249  (1.549 m)     Head Circumference --      Peak Flow --      Pain Score 01/31/17 1249 0     Pain Loc --      Pain  Edu? --      Excl. in GC? --    No data found.   Updated Vital Signs BP (!) 159/90 (BP Location: Left Arm)   Pulse 72   Temp 98.2 F (36.8 C) (Oral)   Resp 12   Ht  (1.549 m)   Wt 142 lb (64.4 kg)   SpO2 97%   BMI 26.83 kg/m   Visual Acuity  Physical Exam  Constitutional: She is oriented to person, place, and time. She appears well-developed and well-nourished. She is active and cooperative.  Non-toxic appearance. She does not have a sickly appearance. She does not appear ill. No distress.  HENT:  Head: Normocephalic.  Right Ear: Tympanic membrane normal.  Left Ear: Tympanic membrane normal.  Nose: Nose normal.  Mouth/Throat: Uvula is midline and mucous membranes are normal.  Eyes: Pupils are equal, round, and reactive to light.  Neck: Trachea normal and normal range of motion. Muscular tenderness present. No Brudzinski's sign and no Kernig's sign noted.    Cardiovascular: Normal rate, regular rhythm and normal pulses.   Pulses:      Dorsalis pedis pulses are 2+ on the right side, and 2+ on the left side.  Pulmonary/Chest: Effort normal and breath sounds normal.  Musculoskeletal:       Left lower leg: She exhibits tenderness and swelling.  Neurological: She is alert and oriented to person, place, and time. No cranial nerve deficit or sensory deficit. GCS eye subscore is 4. GCS verbal subscore is 5. GCS motor subscore is 6.  Skin: Skin is warm and dry. No rash noted. There is erythema.  Left lower leg swelling, erythema left ankle, + TTP of posterior calf  Psychiatric: She has a normal mood and affect. Her speech is normal and behavior is normal.  Nursing note and vitals reviewed.    UC Treatments / Results  Labs (all labs ordered are listed, but only abnormal results are displayed) Labs Reviewed - No data to display  EKG  EKG Interpretation None       Radiology No results found.  Procedures Procedures (including critical care time)  Medications Ordered in UC Medications - No data to display   Initial Impression / Assessment and Plan / UC Course  I have reviewed the triage vital signs and the nursing notes.  Pertinent labs & imaging results that were available during my care of the patient were reviewed by me and considered in my medical decision making (see chart for details).    We are unable to perform [place] of lower extremity in UC, please go to ER or outpatient imaging ([facility]-your choice) for imaging of left knee/ tibia  r/o occult fracture and US doppler study of left lower extremity r/o DVT. Rest,ice,elevate extremity. Pt given option of having imaging completed at UC pt states she wants to have it all done at one place [facility] imaging, advised pt we had no affiliation with that facility, pt verbalized understanding to this provider  Final Clinical Impressions(s) / UC Diagnoses   Final diagnoses:  Pain and swelling of left  lower leg  Acute pain of left knee    New Prescriptions Discharge Medication List as of 01/31/2017  1:15 PM       Controlled Substance Prescriptions    Trinady Milewski, Para March, NP 01/31/17 1452

## 2017-04-26 DIAGNOSIS — I129 Hypertensive chronic kidney disease with stage 1 through stage 4 chronic kidney disease, or unspecified chronic kidney disease: Secondary | ICD-10-CM | POA: Diagnosis not present

## 2017-04-26 DIAGNOSIS — Z79899 Other long term (current) drug therapy: Secondary | ICD-10-CM | POA: Diagnosis not present

## 2017-04-26 DIAGNOSIS — Z7982 Long term (current) use of aspirin: Secondary | ICD-10-CM | POA: Diagnosis not present

## 2017-04-26 DIAGNOSIS — E785 Hyperlipidemia, unspecified: Secondary | ICD-10-CM | POA: Diagnosis not present

## 2017-04-26 DIAGNOSIS — N184 Chronic kidney disease, stage 4 (severe): Secondary | ICD-10-CM | POA: Diagnosis not present

## 2017-04-26 DIAGNOSIS — Z882 Allergy status to sulfonamides status: Secondary | ICD-10-CM | POA: Diagnosis not present

## 2017-05-29 DIAGNOSIS — I129 Hypertensive chronic kidney disease with stage 1 through stage 4 chronic kidney disease, or unspecified chronic kidney disease: Secondary | ICD-10-CM | POA: Diagnosis not present

## 2017-05-29 DIAGNOSIS — N183 Chronic kidney disease, stage 3 (moderate): Secondary | ICD-10-CM | POA: Diagnosis not present

## 2017-06-12 DIAGNOSIS — H2513 Age-related nuclear cataract, bilateral: Secondary | ICD-10-CM | POA: Diagnosis not present

## 2017-07-23 DIAGNOSIS — J040 Acute laryngitis: Secondary | ICD-10-CM | POA: Diagnosis not present

## 2017-07-30 DIAGNOSIS — J040 Acute laryngitis: Secondary | ICD-10-CM | POA: Diagnosis not present

## 2017-07-30 DIAGNOSIS — J40 Bronchitis, not specified as acute or chronic: Secondary | ICD-10-CM | POA: Diagnosis not present

## 2017-08-07 DIAGNOSIS — N183 Chronic kidney disease, stage 3 (moderate): Secondary | ICD-10-CM | POA: Diagnosis not present

## 2017-10-02 DIAGNOSIS — M5431 Sciatica, right side: Secondary | ICD-10-CM | POA: Diagnosis not present

## 2018-03-18 DIAGNOSIS — N183 Chronic kidney disease, stage 3 (moderate): Secondary | ICD-10-CM | POA: Diagnosis not present

## 2018-03-18 DIAGNOSIS — I129 Hypertensive chronic kidney disease with stage 1 through stage 4 chronic kidney disease, or unspecified chronic kidney disease: Secondary | ICD-10-CM | POA: Diagnosis not present

## 2018-03-18 DIAGNOSIS — I10 Essential (primary) hypertension: Secondary | ICD-10-CM | POA: Diagnosis not present
